# Patient Record
Sex: Female | Born: 1960 | Race: Black or African American | Hispanic: No | Smoking: Current every day smoker
Health system: Southern US, Community
[De-identification: ages and names within clinical notes are randomized; demographics above are authoritative.]

## PROBLEM LIST (undated history)

## (undated) DIAGNOSIS — G8929 Other chronic pain: Secondary | ICD-10-CM

## (undated) DIAGNOSIS — M5430 Sciatica, unspecified side: Secondary | ICD-10-CM

## (undated) DIAGNOSIS — M722 Plantar fascial fibromatosis: Secondary | ICD-10-CM

## (undated) HISTORY — PX: CARPAL TUNNEL RELEASE: SHX101

## (undated) HISTORY — PX: OOPHORECTOMY: SHX86

## (undated) HISTORY — PX: ABDOMINAL HYSTERECTOMY: SHX81

---

## 2008-08-17 ENCOUNTER — Ambulatory Visit: Payer: Self-pay | Admitting: Family Medicine

## 2008-12-19 ENCOUNTER — Ambulatory Visit: Payer: Self-pay | Admitting: Family Medicine

## 2011-01-14 ENCOUNTER — Ambulatory Visit: Payer: Self-pay | Admitting: Family Medicine

## 2011-01-27 ENCOUNTER — Ambulatory Visit: Payer: Self-pay | Admitting: Family Medicine

## 2011-03-13 ENCOUNTER — Encounter: Payer: Self-pay | Admitting: Orthopedic Surgery

## 2011-03-16 ENCOUNTER — Encounter: Payer: Self-pay | Admitting: Orthopedic Surgery

## 2011-04-15 ENCOUNTER — Encounter: Payer: Self-pay | Admitting: Orthopedic Surgery

## 2012-08-15 ENCOUNTER — Emergency Department: Payer: Self-pay | Admitting: Internal Medicine

## 2013-06-16 ENCOUNTER — Ambulatory Visit: Payer: Self-pay | Admitting: Family Medicine

## 2013-07-18 ENCOUNTER — Encounter: Payer: Self-pay | Admitting: Specialist

## 2013-08-15 ENCOUNTER — Encounter: Payer: Self-pay | Admitting: Specialist

## 2013-09-14 ENCOUNTER — Encounter: Payer: Self-pay | Admitting: Specialist

## 2014-05-15 ENCOUNTER — Ambulatory Visit: Payer: Self-pay | Admitting: Family Medicine

## 2014-05-30 ENCOUNTER — Emergency Department: Payer: Self-pay | Admitting: Internal Medicine

## 2014-07-04 ENCOUNTER — Ambulatory Visit: Payer: Self-pay | Admitting: Family Medicine

## 2017-12-22 ENCOUNTER — Encounter: Payer: Self-pay | Admitting: Family Medicine

## 2017-12-22 ENCOUNTER — Ambulatory Visit: Payer: PRIVATE HEALTH INSURANCE | Admitting: Family Medicine

## 2017-12-22 VITALS — BP 120/72 | HR 86 | Temp 98.2°F | Resp 16 | Ht 61.0 in | Wt 158.0 lb

## 2017-12-22 DIAGNOSIS — M25611 Stiffness of right shoulder, not elsewhere classified: Secondary | ICD-10-CM | POA: Diagnosis not present

## 2017-12-22 DIAGNOSIS — S4991XA Unspecified injury of right shoulder and upper arm, initial encounter: Secondary | ICD-10-CM

## 2017-12-22 NOTE — Progress Notes (Signed)
Name: Kayla Monroe   MRN: [id number]    DOB: 1961-04-30   Date:12/22/2017       Progress Note  Subjective  Chief Complaint  Chief Complaint  Patient presents with  . Arm Pain    right arm pain after fall for 2 weeks    HPI  Patient presents with concern for RIGHT shoulder pain 2 weeks ago.  She tripped and fell - grabbed a banister, but still fell and hit her right shoulder.  She has tried some exercises she looked up online, took some advil which helped a little bit with the pain.  She notes that pain has started to radiate down the RIGHT upper arm from her shoulder. She did not note any swelling or bruising after the injury.  Has had very small amount of tingling, no numbness. Did not feel/hear any pops or clicks upon falling, but notes that her shoulder joint is now having this occur as well.  There are no active problems to display for this patient.   Social History   Tobacco Use  . Smoking status: Former Smoker    Types: Cigarettes    Start date: 01/01/2017  . Smokeless tobacco: Current User  Substance Use Topics  . Alcohol use: Yes    Frequency: Never    Comment: occasional    No current outpatient medications on file.  Not on File  ROS  Constitutional: Negative for fever or weight change.  Respiratory: Negative for cough and shortness of breath.   Cardiovascular: Negative for chest pain or palpitations.  Gastrointestinal: Negative for abdominal pain, no bowel changes.  Musculoskeletal: Negative for gait problem or joint swelling.  Skin: Negative for rash.  Neurological: Negative for dizziness or headache.  No other specific complaints in a complete review of systems (except as listed in HPI above).  Objective  Vitals:   12/22/17 1113  BP: 120/72  Pulse: 86  Resp: 16  Temp: 98.2 F (36.8 C)  TempSrc: Oral  SpO2: 99%  Weight: 158 lb (71.7 kg)  Height: 5\' 1"  (1.549 m)   Body mass index is 29.85 kg/m.  Nursing Note and Vital Signs reviewed.  Physical  Exam  Constitutional: Patient appears well-developed and well-nourished. Obese. No distress.  HEENT: head atraumatic Cardiovascular: Normal rate, regular rhythm, S1/S2 present.  No murmur or rub heard. No BLE edema. Pulmonary/Chest: Effort normal and breath sounds clear. No respiratory distress or retractions. Psychiatric: Patient has a normal mood and affect. behavior is normal. Judgment and thought content normal. MSK: No joint effusion or deformity noted. Posterior and lateral shoulder exhibit point tenderness. Limited Abduction - unable to raise LUE above 90 degrees.  No erythema or edema.  No results found for this or any previous visit (from the past 2160 hour(s)).   Assessment & Plan  1. Injury of right shoulder, initial encounter - Ambulatory referral to Orthopedic Surgery  2. Decreased range of motion of right shoulder - Ambulatory referral to Orthopedic Surgery  -Reviewed Health Maintenance: Patient prefers to call back to schedule CPE and to have screenings ordered at that time.  She did have flu shot this year.

## 2020-12-26 ENCOUNTER — Other Ambulatory Visit: Payer: Self-pay

## 2020-12-26 ENCOUNTER — Encounter: Payer: Self-pay | Admitting: Emergency Medicine

## 2020-12-26 ENCOUNTER — Other Ambulatory Visit: Payer: Self-pay | Admitting: Physical Medicine and Rehabilitation

## 2020-12-26 ENCOUNTER — Emergency Department
Admission: EM | Admit: 2020-12-26 | Discharge: 2020-12-26 | Disposition: A | Discharge status: Home / Self Care | Payer: BC Managed Care – PPO | Attending: Emergency Medicine | Admitting: Emergency Medicine

## 2020-12-26 ENCOUNTER — Emergency Department: Payer: BC Managed Care – PPO

## 2020-12-26 DIAGNOSIS — S6391XA Sprain of unspecified part of right wrist and hand, initial encounter: Secondary | ICD-10-CM | POA: Diagnosis not present

## 2020-12-26 DIAGNOSIS — M5442 Lumbago with sciatica, left side: Secondary | ICD-10-CM

## 2020-12-26 DIAGNOSIS — X500XXA Overexertion from strenuous movement or load, initial encounter: Secondary | ICD-10-CM | POA: Insufficient documentation

## 2020-12-26 DIAGNOSIS — S6991XA Unspecified injury of right wrist, hand and finger(s), initial encounter: Secondary | ICD-10-CM | POA: Diagnosis present

## 2020-12-26 DIAGNOSIS — M5441 Lumbago with sciatica, right side: Secondary | ICD-10-CM

## 2020-12-26 DIAGNOSIS — Z87891 Personal history of nicotine dependence: Secondary | ICD-10-CM | POA: Diagnosis not present

## 2020-12-26 HISTORY — DX: Plantar fascial fibromatosis: M72.2

## 2020-12-26 HISTORY — DX: Other chronic pain: G89.29

## 2020-12-26 HISTORY — DX: Sciatica, unspecified side: M54.30

## 2020-12-26 MED ORDER — HYDROCODONE-ACETAMINOPHEN 5-325 MG PO TABS
2.0000 | ORAL_TABLET | Freq: Once | ORAL | Status: AC
  Administered 2020-12-26: 2 via ORAL
  Filled 2020-12-26: qty 2

## 2020-12-26 MED ORDER — HYDROCODONE-ACETAMINOPHEN 5-325 MG PO TABS: 2.0000 | ORAL_TABLET | Freq: Four times a day (QID) | ORAL | 0 refills | Status: AC | PRN

## 2020-12-26 NOTE — ED Triage Notes (Signed)
Pt to ED from home c/o right thumb pain since yesterday.  States thinks she may have hurt it at work doing repetitive motions but denies known injury.  Pt wearing brace from home.

## 2020-12-26 NOTE — ED Notes (Signed)
Patient discharged to home per MD order. Patient in stable condition, and deemed medically cleared by ED provider for discharge. Discharge instructions reviewed with patient/family using "Teach Back"; verbalized understanding of medication education and administration, and information about follow-up care. Denies further concerns. ° °

## 2020-12-26 NOTE — ED Provider Notes (Signed)
[facility] Emergency Department Provider Note  ____________________________________________   Event Date/Time   First MD Initiated Contact with Patient 12/26/20 [phone]     (approximate)  I have reviewed the triage vital signs and the nursing notes.   HISTORY  Chief Complaint Hand Pain    HPI Kayla Monroe is a 60 y.o. female with medical history as listed below who presents for evaluation of acute onset pain in her right hand.  She did not have any specific trauma but says she thinks it is due to work and repetitive motion.  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  She said that about 2 days ago she started to have some pain in the hand but that it is steadily gotten worse and now it is so severe she can barely move her hand.  She has no numbness nor tingling.  She thinks there might be some swelling.  She said that she was not able to work and has had some issues in the past where she try to get disability for her back pain but was unable to do so but she is concerned that the current issue is caused by her work.  The pain is isolated to her hand and possibly part of her wrist but does not radiate up the arm or shoulder.         Past Medical History:  Diagnosis Date  . Chronic back pain   . Chronic shoulder pain   . Plantar fasciitis   . Sciatica     There are no problems to display for this patient.   Past Surgical History:  Procedure Laterality Date  . ABDOMINAL HYSTERECTOMY    . CARPAL TUNNEL RELEASE Left     Prior to Admission medications   Medication Sig Start Date End Date Taking? Authorizing Provider  HYDROcodone-acetaminophen (NORCO/VICODIN) 5-325 MG tablet Take 2 tablets by mouth every 6 (six) hours as needed for moderate pain or severe pain. 12/26/20  Yes Loleta Rose, MD    Allergies Patient has no known allergies.  Family History  Problem Relation Age of Onset  . Hypertension  Mother   . Hypertension Sister     Social History Social History   Tobacco Use  . Smoking status: Former Smoker    Types: Cigarettes    Start date: 01/01/2017  . Smokeless tobacco: Current User  Substance Use Topics  . Alcohol use: Yes    Comment: occasional  . Drug use: No    Review of Systems Constitutional: No fever/chills Respiratory: Denies shortness of breath. Gastrointestinal: No abdominal pain.   Musculoskeletal: Right hand pain Integumentary: Negative for rash. Neurological: Negative for headaches, focal weakness or numbness.   ____________________________________________   PHYSICAL EXAM:  VITAL SIGNS: ED Triage Vitals  Enc Vitals Group     BP 12/26/20 0019 (!) 182/100     Pulse Rate 12/26/20 0019 94     Resp 12/26/20 0019 16     Temp 12/26/20 0019 98.2 F (36.8 C)     Temp Source 12/26/20 0019 Oral     SpO2 12/26/20 0019 99 %     Weight 12/26/20 0020 65.8 kg (145 lb)     Height 12/26/20 0020 1.549 m (5\' 1" )     Head Circumference --      Peak Flow --      Pain Score 12/26/20 0020 10     Pain Loc --  Pain Edu? --      Excl. in GC? --     Constitutional: Alert and oriented.  Eyes: Conjunctivae are normal.  Head: Atraumatic. Cardiovascular: Normal rate, regular rhythm. Good peripheral circulation. Respiratory: Normal respiratory effort.  No retractions. Musculoskeletal: Patient is wearing a neoprene thumb spica brace on her right hand for comfort that she says she got from a prior issue with possible carpal tunnel syndrome in her left hand.  She is favoring the hand and will only barely tolerate an exam.  There may be some swelling at the base of the thumb on the palmar side, but the compartments are soft though elicit tenderness to palpation.  There is no evidence of infection including no erythema, excessive warmth, fluctuance, nor induration. Neurologic:  Normal speech and language. No gross focal neurologic deficits are appreciated.  Skin:   Skin is warm, dry and intact.  No erythema.  ____________________________________________   LABS (all labs ordered are listed, but only abnormal results are displayed)  Labs Reviewed - No data to display ____________________________________________  EKG  No indication for emergent EKG ____________________________________________  RADIOLOGY I, Loleta Rose, personally viewed and evaluated these images (plain radiographs) as part of my medical decision making, as well as reviewing the written report by the radiologist.  ED MD interpretation: I personally reviewed the patient's imaging and agree with the radiologist's interpretation that there is no evidence of any acute bony abnormality on the hand x-rays.  Official radiology report(s): DG Hand Complete Right  Result Date: 12/26/2020 CLINICAL DATA:  Acute right hand pain, may have injured during repetitive motion, wearing brace from home EXAM: RIGHT HAND - COMPLETE 3+ VIEW COMPARISON:  None. FINDINGS: No acute bony abnormality. Specifically, no fracture, subluxation, or dislocation. Minimal degenerative changes in the hand and wrist. Tiny corticated mineralization between trapezium and scaphoid is favored to reflect a small ossicle. Normal bone mineralization. No conspicuous osseous lesions. Soft tissues are unremarkable. IMPRESSION: No acute osseous abnormality. Minimal degenerative changes in the hand and wrist. Electronically Signed   By: Kreg Shropshire M.D.   On: 12/26/2020 01:01    ____________________________________________   PROCEDURES   Procedure(s) performed (including Critical Care):  Procedures   ____________________________________________   INITIAL IMPRESSION / MDM / ASSESSMENT AND PLAN / ED COURSE  As part of my medical decision making, I reviewed the following data within the electronic MEDICAL RECORD NUMBER Nursing notes reviewed and incorporated, Old chart reviewed, Radiograph reviewed , Notes from prior ED visits  and [place] Controlled Substance Database  No evidence of bony abnormality.  Generally reassuring physical exam although she may have some swelling of the thenar eminence.  The pain the patient is reporting seems to be somewhat out of proportion to the physical exam and the history, but there are no concerning findings that would suggest compartment syndrome or other emergent medical condition.  There is no evidence of vascular compromise.  I believe that she most likely has a sprain in her hand or possibly contusion if she was using power tools and received some kind of repetitive injury.  Regardless I gave her Percocet 2 tablets and a removable Velcro wrist splint for comfort.  I advised the use of ibuprofen 600 mg p.o. 3 times daily with meals, RICE, and close follow-up with orthopedics, preferably Dr. Stephenie Acres since she is the local hand specialist.  I gave her a work note for about 3 days and explained that that is the maximal we can do but she needs to  follow-up with a specialist for additional recommendations.  She understands and agrees with the plan.  Of note, I checked the [place] controlled substance database prior to prescribing Norco and the patient has only one prior prescription in the last 2 years and I feel she is low risk and is clearly experiencing some acute discomfort and would benefit from a very short prescription of 10 tablets.           ____________________________________________  FINAL CLINICAL IMPRESSION(S) / ED DIAGNOSES  Final diagnoses:  Hand sprain, right, initial encounter     MEDICATIONS GIVEN DURING THIS VISIT:  Medications  HYDROcodone-acetaminophen (NORCO/VICODIN) 5-325 MG per tablet 2 tablet (has no administration in time range)     ED Discharge Orders         Ordered    HYDROcodone-acetaminophen (NORCO/VICODIN) 5-325 MG tablet  Every 6 hours PRN        12/26/20 0353          *Please note:  Kayla Monroe was evaluated in Emergency Department on  12/26/2020 for the symptoms described in the history of present illness. She was evaluated in the context of the global COVID-19 pandemic, which necessitated consideration that the patient might be at risk for infection with the SARS-CoV-2 virus that causes COVID-19. Institutional protocols and algorithms that pertain to the evaluation of patients at risk for COVID-19 are in a state of rapid change based on information released by regulatory bodies including the CDC and federal and state organizations. These policies and algorithms were followed during the patient's care in the ED.  Some ED evaluations and interventions may be delayed as a result of limited staffing during and after the pandemic.*  Note:  This document was prepared using Dragon voice recognition software and may include unintentional dictation errors.   Loleta Rose, MD 12/26/20 [phone]

## 2020-12-26 NOTE — Discharge Instructions (Signed)
As we discussed, you your x-rays are reassuring, and it appears that you have a sprain in your hand.  Please read through the included information about rest, ice, compression, and elevation (RICE) therapy.  Use the provided a Velcro splint for comfort.  Follow-up at the next available opportunity with Dr. Stephenie Acres or another orthopedic provider for additional evaluation and management recommendations.  We recommend that you use ibuprofen 600 mg by mouth three times a day with meals. Take Norco as prescribed for severe pain. Do not drink alcohol, drive or participate in any other potentially dangerous activities while taking this medication as it may make you sleepy. Do not take this medication with any other sedating medications, either prescription or over-the-counter. If you were prescribed Percocet or Vicodin, do not take these with acetaminophen (Tylenol) as it is already contained within these medications.   This medication is an opiate (or narcotic) pain medication and can be habit forming.  Use it as little as possible to achieve adequate pain control.  Do not use or use it with extreme caution if you have a history of opiate abuse or dependence.  If you are on a pain contract with your primary care doctor or a pain specialist, be sure to let them know you were prescribed this medication today from the [facility] Emergency Department.  This medication is intended for your use only - do not give any to anyone else and keep it in a secure place where nobody else, especially children, have access to it.  It will also cause or worsen constipation, so you may want to consider taking an over-the-counter stool softener while you are taking this medication.

## 2021-01-02 ENCOUNTER — Other Ambulatory Visit: Payer: Self-pay

## 2021-01-02 ENCOUNTER — Ambulatory Visit: Admission: RE | Admit: 2021-01-02 | Payer: BC Managed Care – PPO | Source: Ambulatory Visit

## 2021-01-02 ENCOUNTER — Ambulatory Visit
Admission: RE | Admit: 2021-01-02 | Discharge: 2021-01-02 | Disposition: A | Discharge status: Home / Self Care | Payer: BC Managed Care – PPO | Source: Ambulatory Visit | Attending: Physical Medicine and Rehabilitation | Admitting: Physical Medicine and Rehabilitation

## 2021-01-02 DIAGNOSIS — M5441 Lumbago with sciatica, right side: Secondary | ICD-10-CM | POA: Diagnosis present

## 2021-01-02 DIAGNOSIS — M5442 Lumbago with sciatica, left side: Secondary | ICD-10-CM | POA: Insufficient documentation

## 2021-07-17 ENCOUNTER — Other Ambulatory Visit: Payer: Self-pay | Admitting: Internal Medicine

## 2021-07-17 DIAGNOSIS — Z1231 Encounter for screening mammogram for malignant neoplasm of breast: Secondary | ICD-10-CM

## 2021-08-06 ENCOUNTER — Ambulatory Visit
Admission: RE | Admit: 2021-08-06 | Discharge: 2021-08-06 | Disposition: A | Discharge status: Home / Self Care | Payer: BC Managed Care – PPO | Source: Ambulatory Visit | Attending: Internal Medicine | Admitting: Internal Medicine

## 2021-08-06 ENCOUNTER — Other Ambulatory Visit: Payer: Self-pay

## 2021-08-06 DIAGNOSIS — Z1231 Encounter for screening mammogram for malignant neoplasm of breast: Secondary | ICD-10-CM | POA: Insufficient documentation

## 2021-08-13 ENCOUNTER — Other Ambulatory Visit: Payer: Self-pay | Admitting: Internal Medicine

## 2021-08-13 DIAGNOSIS — R928 Other abnormal and inconclusive findings on diagnostic imaging of breast: Secondary | ICD-10-CM

## 2021-08-13 DIAGNOSIS — N6489 Other specified disorders of breast: Secondary | ICD-10-CM

## 2021-08-15 ENCOUNTER — Other Ambulatory Visit: Payer: BC Managed Care – PPO

## 2021-08-15 ENCOUNTER — Ambulatory Visit: Admission: RE | Admit: 2021-08-15 | Payer: BC Managed Care – PPO | Source: Ambulatory Visit

## 2021-08-21 ENCOUNTER — Other Ambulatory Visit: Payer: Self-pay

## 2021-08-21 ENCOUNTER — Ambulatory Visit
Admission: RE | Admit: 2021-08-21 | Discharge: 2021-08-21 | Disposition: A | Discharge status: Home / Self Care | Payer: BC Managed Care – PPO | Source: Ambulatory Visit | Attending: Internal Medicine | Admitting: Internal Medicine

## 2021-08-21 DIAGNOSIS — R928 Other abnormal and inconclusive findings on diagnostic imaging of breast: Secondary | ICD-10-CM | POA: Insufficient documentation

## 2021-08-21 DIAGNOSIS — N6489 Other specified disorders of breast: Secondary | ICD-10-CM | POA: Insufficient documentation

## 2021-08-23 ENCOUNTER — Other Ambulatory Visit: Payer: Self-pay | Admitting: Internal Medicine

## 2021-08-23 DIAGNOSIS — N6489 Other specified disorders of breast: Secondary | ICD-10-CM

## 2022-01-02 ENCOUNTER — Encounter: Payer: Self-pay | Admitting: Advanced Practice Midwife

## 2022-01-02 ENCOUNTER — Other Ambulatory Visit: Payer: Self-pay

## 2022-01-02 ENCOUNTER — Ambulatory Visit: Payer: Self-pay | Admitting: Advanced Practice Midwife

## 2022-01-02 DIAGNOSIS — Z113 Encounter for screening for infections with a predominantly sexual mode of transmission: Secondary | ICD-10-CM

## 2022-01-02 DIAGNOSIS — F17200 Nicotine dependence, unspecified, uncomplicated: Secondary | ICD-10-CM

## 2022-01-02 DIAGNOSIS — Z90721 Acquired absence of ovaries, unilateral: Secondary | ICD-10-CM | POA: Insufficient documentation

## 2022-01-02 DIAGNOSIS — B009 Herpesviral infection, unspecified: Secondary | ICD-10-CM | POA: Insufficient documentation

## 2022-01-02 DIAGNOSIS — F1010 Alcohol abuse, uncomplicated: Secondary | ICD-10-CM | POA: Insufficient documentation

## 2022-01-02 LAB — WET PREP FOR TRICH, YEAST, CLUE
Trichomonas Exam: NEGATIVE
Yeast Exam: NEGATIVE

## 2022-01-02 NOTE — Progress Notes (Signed)
Pt here for STD screening.  Wet mount results reviewed, no treatment required per SO.  Sante Biedermann M Kerby Hockley, RN ? ?

## 2022-01-02 NOTE — Progress Notes (Signed)
[facility] Department  STI clinic/screening visit [address] [place] [address] [phone]  Subjective:  Kayla Monroe is a 61 y.o. SBF G3P0 smoker female being seen today for an STI screening visit. The patient reports they do have symptoms.  Patient reports that they do not desire a pregnancy in the next year.   They reported they are not interested in discussing contraception today.    No LMP recorded. Patient is postmenopausal.   Patient has the following medical conditions:   Patient Active Problem List   Diagnosis Date Noted   Smoker 1 1/2 ppd 01/02/2022   Alcohol abuse 01/02/2022    Chief Complaint  Patient presents with   SEXUALLY TRANSMITTED DISEASE    Screening    HPI  Patient reports has tiny "bumps" on vulva and face since 2022 that appear, enlarge, and then resolve spontaneously. 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 Smoking 1- 1.5 ppd. LMP in her 30's. Last sex 2021. Last MJ as a teen. Last ETOH 12/31/21 (1 pint cognac) 3-4x/wk. Left ovary and tube removed in her 20's. States she uses "sex toys" and doesn't wash them after use  Last HIV test per patient/review of record was ? Patient reports last pap was 2022 neg  Screening for MPX risk: Does the patient have an unexplained rash? No Is the patient MSM? No Does the patient endorse multiple sex partners or anonymous sex partners? No Did the patient have close or sexual contact with a person diagnosed with MPX? No Has the patient traveled outside the [place] where MPX is endemic? No Is there a high clinical suspicion for MPX-- evidenced by one of the following No  -Unlikely to be chickenpox  -Lymphadenopathy  -Rash that present in same phase of evolution on any given body part See flowsheet for further details and programmatic requirements.    The following portions of the patient's history were  reviewed and updated as appropriate: allergies, current medications, past medical history, past social history, past surgical history and problem list.  Objective:  There were no vitals filed for this visit.  Physical Exam Vitals and nursing note reviewed.  Constitutional:      Appearance: Normal appearance. She is normal weight.  HENT:     Head: Normocephalic and atraumatic.     Mouth/Throat:     Mouth: Mucous membranes are moist.     Pharynx: Oropharynx is clear. No oropharyngeal exudate or posterior oropharyngeal erythema.  Pulmonary:     Effort: Pulmonary effort is normal.  Abdominal:     Palpations: Abdomen is soft. There is no mass.     Tenderness: There is no abdominal tenderness. There is no rebound.     Comments: Soft without masses or tenderness  Genitourinary:    General: Normal vulva.     Exam position: Lithotomy position.     Pubic Area: No rash or pubic lice.      Labia:        Right: No rash or lesion.        Left: No rash or lesion.      Vagina: Vaginal discharge (small amt white creamy leukorrhea, ph<4.5) present. No erythema, bleeding or lesions.     Cervix: Normal.     Uterus: Normal.      Adnexa: Right adnexa normal and left adnexa normal.     Rectum: Normal.  Comments: 2 small round raised white areas nontender which pt states she's had off and on intermitently since 2022. She went to Urgent care last year and recently an Ob/Gyn with dx "hair bump". Small ~2 mm sl raised cystic nontender area "comes and goes" since a few years ago Lymphadenopathy:     Head:     Right side of head: No preauricular or posterior auricular adenopathy.     Left side of head: No preauricular or posterior auricular adenopathy.     Cervical: No cervical adenopathy.     Right cervical: No superficial, deep or posterior cervical adenopathy.    Left cervical: No superficial, deep or posterior cervical adenopathy.     Upper Body:     Right upper body: No supraclavicular or  axillary adenopathy.     Left upper body: No supraclavicular or axillary adenopathy.     Lower Body: No right inguinal adenopathy. No left inguinal adenopathy.  Skin:    General: Skin is warm and dry.     Findings: No rash.  Neurological:     Mental Status: She is alert and oriented to person, place, and time.     Assessment and Plan:  Kayla Monroe is a 61 y.o. female presenting to the [facility] Department for STI screening  1. Screening examination for venereal disease Treat wet mount per standing orders  Immunization nurse consult Appear to be small white vesicles benign - WET PREP FOR TRICH, YEAST, CLUE - Chlamydia/Gonorrhea [facility]  2. Smoker 1 1/2 ppd Counseled via 5 A's to stop   3. Alcohol abuse 3-4x/wk (1 pint cognac); declines ETOH cessation assistance     Return if symptoms worsen or fail to improve.  No future appointments.  Alberteen Spindle, CNM

## 2022-01-11 IMAGING — MG MM DIGITAL SCREENING BILAT W/ TOMO AND CAD
6 of 10 series · 6 of 30 positions shown · non-contrast
Comparison: None.

CLINICAL DATA: Screening.

EXAM:
DIGITAL SCREENING BILATERAL MAMMOGRAM WITH TOMOSYNTHESIS AND CAD
TECHNIQUE: Bilateral screening digital craniocaudal and mediolateral oblique
mammograms were obtained. Bilateral screening digital breast
tomosynthesis was performed. The images were evaluated with
computer-aided detection.

[R CC synth-2D]
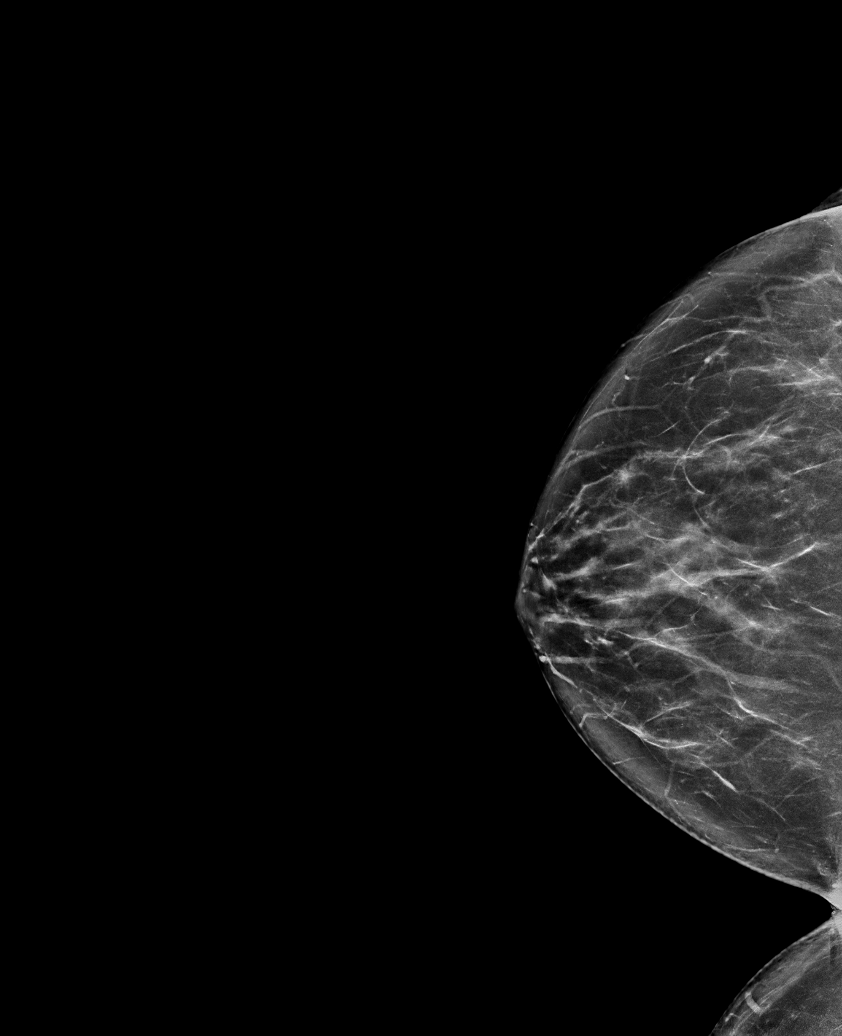

[R MLO synth-2D]
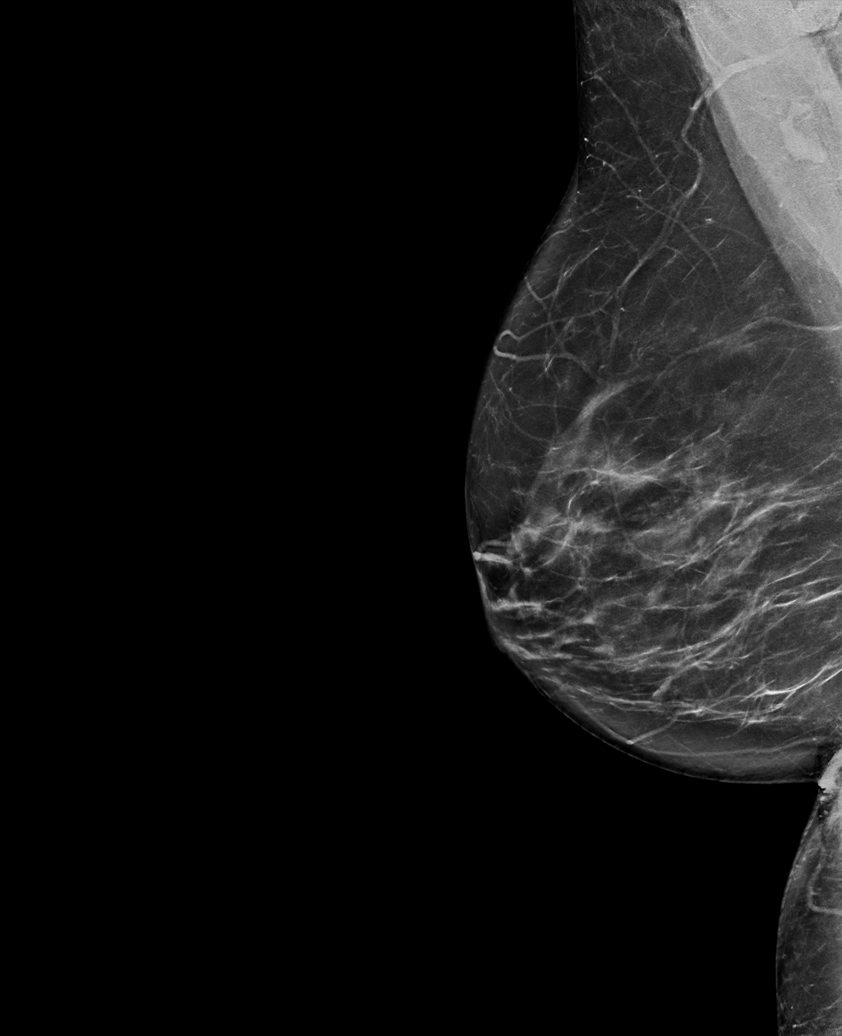

[L XCCL synth-2D]
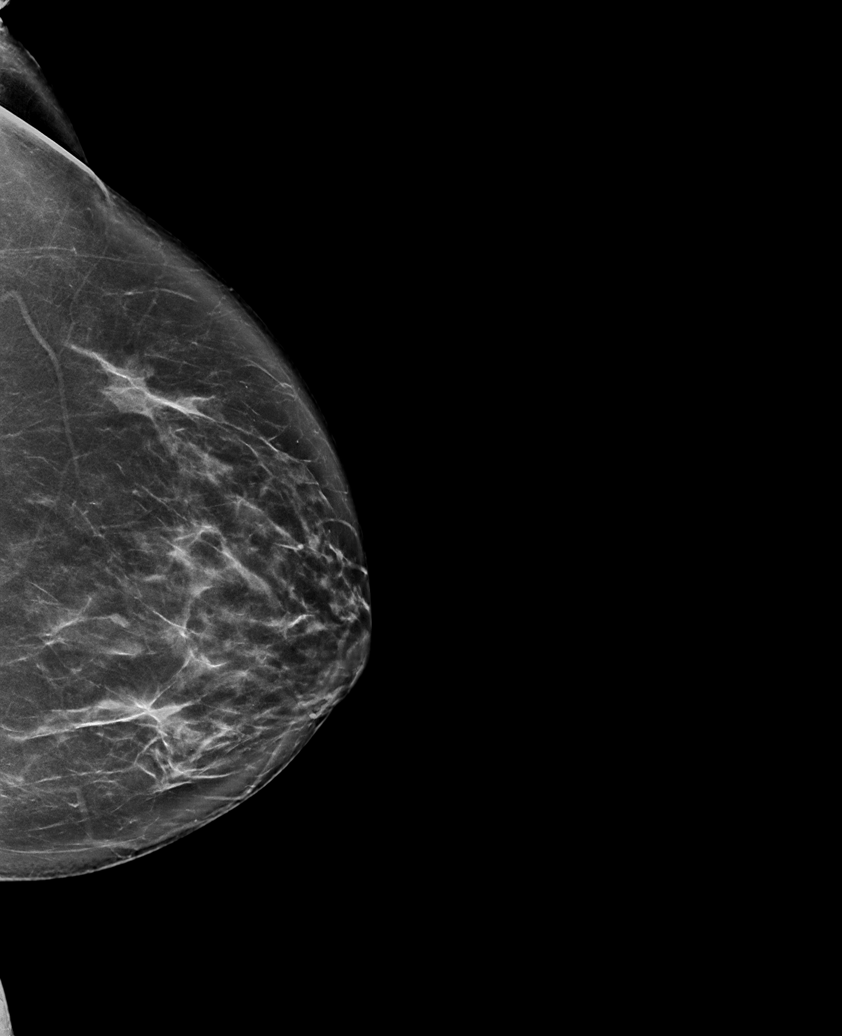

[L MLO synth-2D]
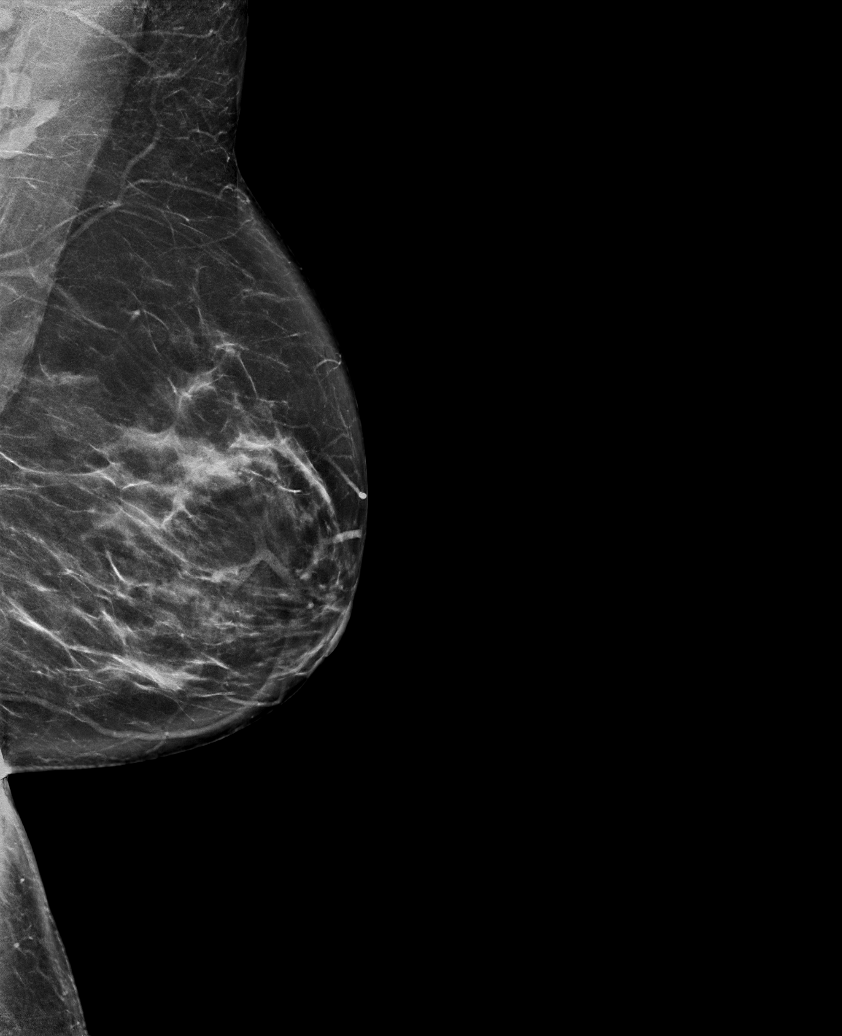

[L CC synth-2D]
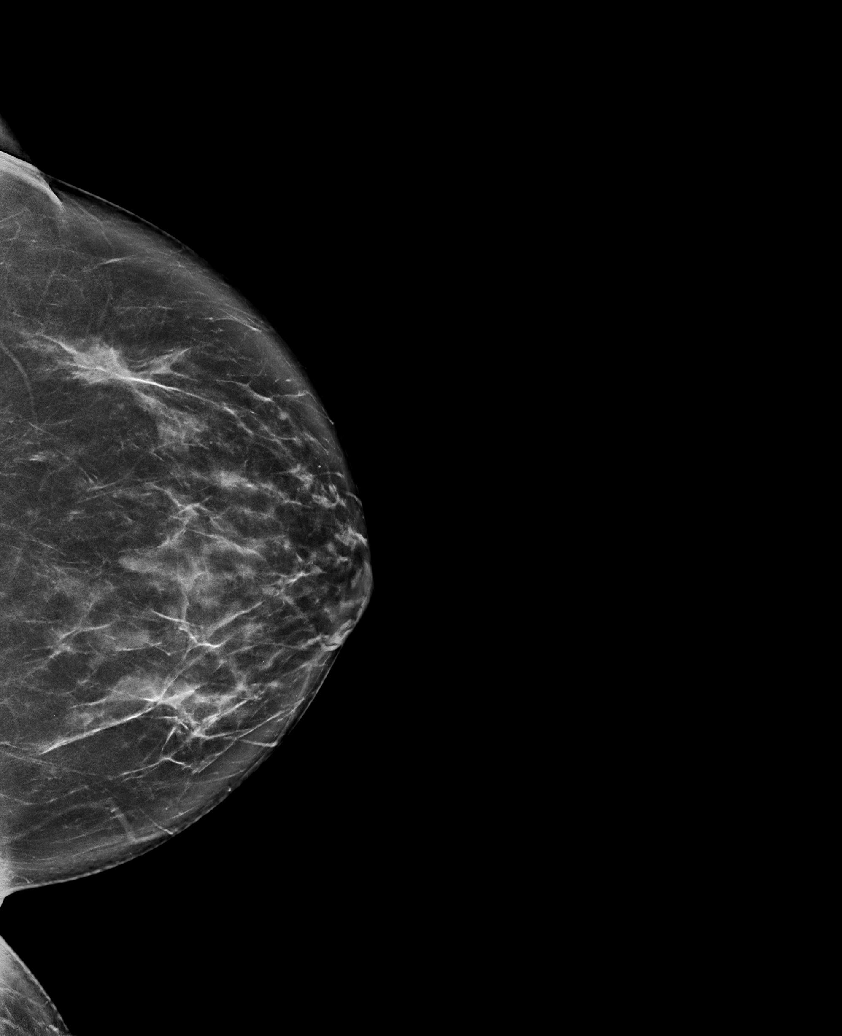

[L XCCL tomo · tomo slice 41/81.0]
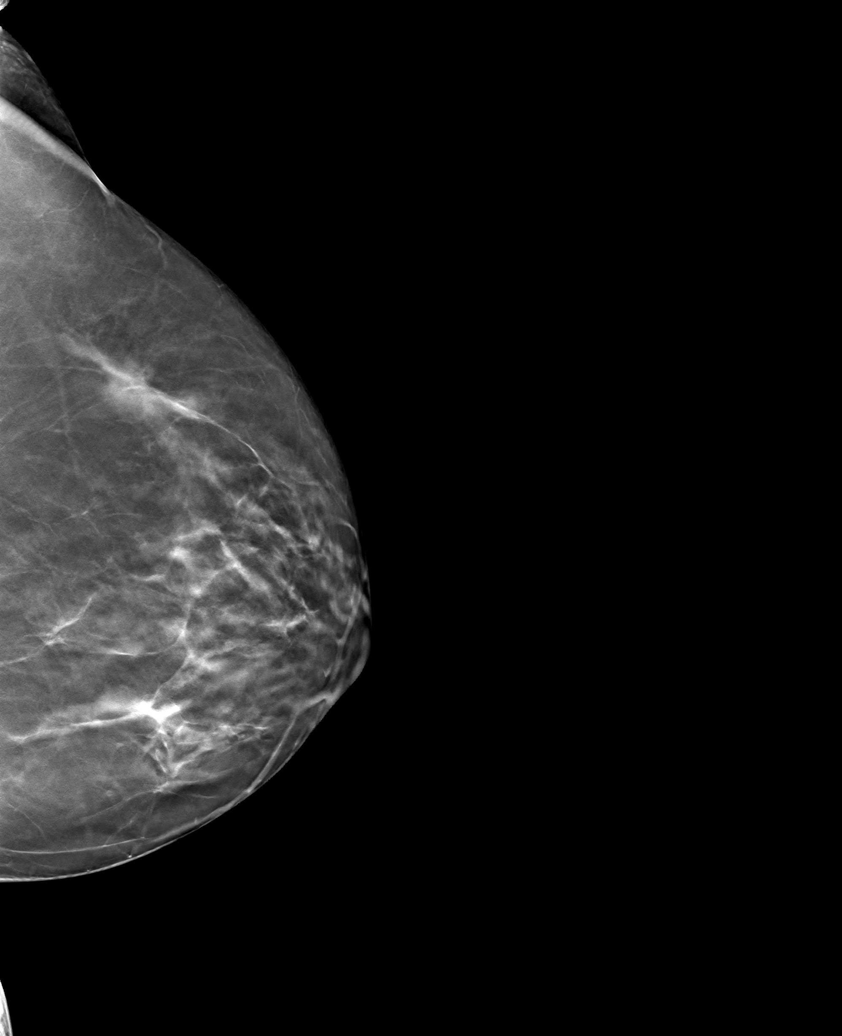

[6 of 30 positions shown; findings below may reference images not displayed]

New baseline

ACR Breast Density Category b: There are scattered areas of
fibroglandular density.
FINDINGS: In the left breast, a possible asymmetry and a possible focal
asymmetry warrants further evaluation. In the right breast, no
findings suspicious for malignancy.
IMPRESSION: Further evaluation is suggested for possible asymmetry and focal
asymmetry in the left breast.

RECOMMENDATION:
Diagnostic mammogram and possibly ultrasound of the left breast.
(Code:QI-Z-JJX)

The patient will be contacted regarding the findings, and additional
imaging will be scheduled.

BI-RADS CATEGORY  0: Incomplete. Need additional imaging evaluation
and/or prior mammograms for comparison.

## 2024-04-11 ENCOUNTER — Other Ambulatory Visit: Payer: Self-pay | Admitting: Internal Medicine

## 2024-04-11 DIAGNOSIS — N6489 Other specified disorders of breast: Secondary | ICD-10-CM

## 2024-04-11 DIAGNOSIS — Z1231 Encounter for screening mammogram for malignant neoplasm of breast: Secondary | ICD-10-CM

## 2024-04-22 ENCOUNTER — Ambulatory Visit
Admission: RE | Admit: 2024-04-22 | Discharge: 2024-04-22 | Disposition: A | Discharge status: Home / Self Care | Source: Ambulatory Visit | Attending: Internal Medicine | Admitting: Internal Medicine

## 2024-04-22 DIAGNOSIS — N6489 Other specified disorders of breast: Secondary | ICD-10-CM

## 2024-04-22 DIAGNOSIS — Z1231 Encounter for screening mammogram for malignant neoplasm of breast: Secondary | ICD-10-CM
